# Patient Record
Sex: Female | Born: 1970 | Race: Black or African American | Hispanic: No | Marital: Single | Smoking: Current some day smoker
Health system: Southern US, Community
[De-identification: ages and names within clinical notes are randomized; demographics above are authoritative.]

---

## 1999-11-03 ENCOUNTER — Other Ambulatory Visit: Admission: RE | Admit: 1999-11-03 | Discharge: 1999-11-03 | Payer: Self-pay | Admitting: Obstetrics

## 1999-11-03 ENCOUNTER — Encounter (INDEPENDENT_AMBULATORY_CARE_PROVIDER_SITE_OTHER): Payer: Self-pay

## 2001-06-25 ENCOUNTER — Other Ambulatory Visit: Admission: RE | Admit: 2001-06-25 | Discharge: 2001-06-25 | Payer: Self-pay | Admitting: Obstetrics and Gynecology

## 2001-08-22 ENCOUNTER — Other Ambulatory Visit: Admission: RE | Admit: 2001-08-22 | Discharge: 2001-08-22 | Payer: Self-pay | Admitting: Obstetrics and Gynecology

## 2002-04-13 ENCOUNTER — Other Ambulatory Visit: Admission: RE | Admit: 2002-04-13 | Discharge: 2002-04-13 | Payer: Self-pay | Admitting: Obstetrics and Gynecology

## 2002-08-13 ENCOUNTER — Encounter (INDEPENDENT_AMBULATORY_CARE_PROVIDER_SITE_OTHER): Payer: Self-pay | Admitting: Specialist

## 2002-08-13 ENCOUNTER — Ambulatory Visit (HOSPITAL_COMMUNITY): Admission: RE | Admit: 2002-08-13 | Discharge: 2002-08-13 | Payer: Self-pay | Admitting: Obstetrics and Gynecology

## 2003-07-13 ENCOUNTER — Other Ambulatory Visit: Admission: RE | Admit: 2003-07-13 | Discharge: 2003-07-13 | Payer: Self-pay | Admitting: Obstetrics and Gynecology

## 2004-07-13 ENCOUNTER — Other Ambulatory Visit: Admission: RE | Admit: 2004-07-13 | Discharge: 2004-07-13 | Payer: Self-pay | Admitting: Obstetrics and Gynecology

## 2005-07-19 ENCOUNTER — Other Ambulatory Visit: Admission: RE | Admit: 2005-07-19 | Discharge: 2005-07-19 | Payer: Self-pay | Admitting: Obstetrics and Gynecology

## 2006-09-23 ENCOUNTER — Other Ambulatory Visit: Admission: RE | Admit: 2006-09-23 | Discharge: 2006-09-23 | Payer: Self-pay | Admitting: Obstetrics and Gynecology

## 2007-08-18 ENCOUNTER — Encounter: Admission: RE | Admit: 2007-08-18 | Discharge: 2007-08-18 | Payer: Self-pay | Admitting: Family Medicine

## 2007-10-13 ENCOUNTER — Other Ambulatory Visit: Admission: RE | Admit: 2007-10-13 | Discharge: 2007-10-13 | Payer: Self-pay | Admitting: Obstetrics and Gynecology

## 2008-05-20 ENCOUNTER — Other Ambulatory Visit: Admission: RE | Admit: 2008-05-20 | Discharge: 2008-05-20 | Payer: Self-pay | Admitting: Obstetrics and Gynecology

## 2008-10-29 ENCOUNTER — Other Ambulatory Visit: Admission: RE | Admit: 2008-10-29 | Discharge: 2008-10-29 | Payer: Self-pay | Admitting: Obstetrics and Gynecology

## 2009-08-19 ENCOUNTER — Other Ambulatory Visit: Admission: RE | Admit: 2009-08-19 | Discharge: 2009-08-19 | Payer: Self-pay | Admitting: Obstetrics and Gynecology

## 2010-02-21 ENCOUNTER — Inpatient Hospital Stay (HOSPITAL_COMMUNITY): Admission: AD | Admit: 2010-02-21 | Discharge: 2010-02-24 | Payer: Self-pay | Admitting: Obstetrics and Gynecology

## 2010-08-10 LAB — CBC
HCT: 27.4 % — ABNORMAL LOW (ref 36.0–46.0)
HCT: 33.4 % — ABNORMAL LOW (ref 36.0–46.0)
Hemoglobin: 11.2 g/dL — ABNORMAL LOW (ref 12.0–15.0)
Hemoglobin: 9.3 g/dL — ABNORMAL LOW (ref 12.0–15.0)
MCH: 28.8 pg (ref 26.0–34.0)
MCHC: 33.5 g/dL (ref 30.0–36.0)
MCHC: 33.9 g/dL (ref 30.0–36.0)
MCV: 87.2 fL (ref 78.0–100.0)
RBC: 3.9 MIL/uL (ref 3.87–5.11)
RDW: 15.1 % (ref 11.5–15.5)
WBC: 15.4 10*3/uL — ABNORMAL HIGH (ref 4.0–10.5)

## 2010-08-10 LAB — RPR: RPR Ser Ql: NONREACTIVE

## 2010-10-13 NOTE — Op Note (Signed)
NAMECOLLEEN, KOTLARZ                          ACCOUNT NO.:  [id number]   MEDICAL RECORD NO.:  [id number]                   PATIENT TYPE:  AMB   LOCATION:  SDC                                  FACILITY:  [facility]   PHYSICIAN:  Janine Limbo, M.D.            DATE OF BIRTH:  Oct 16, 1970   DATE OF PROCEDURE:  08/13/2002  DATE OF DISCHARGE:                                 OPERATIVE REPORT   PREOPERATIVE DIAGNOSES:  1. Cervical intraepithelial neoplasia III.  2. 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   POSTOPERATIVE DIAGNOSES:  1. Cervical intraepithelial neoplasia III.  2. 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   PROCEDURE:  Cold knife conization of the cervix.   SURGEON:  Janine Limbo, M.D.   ANESTHESIA:  Monitored anesthetic control, paracervical block.   DISPOSITION:  The patient is a 40 year old female gravida 0 who presents  with the above mentioned diagnoses.  She understands the indications for her  procedure and she accepts the risks of, but not limited to, anesthetic  complications, bleeding, infection, and possible damage to the surrounding  organs.   FINDINGS:  There was a small area of nonstaining that surrounded the  endocervical os.  The uterus and adnexa were normal by examination.   PROCEDURE:  The patient was taken to the operating room where she was given  medications through her IV line.  The patient's perineum and vagina were  prepped with multiple layers of Betadine.  Examination under anesthesia was  performed.  The bladder was drained of urine.  The patient was sterilely  draped.  A paracervical block was placed using 10 mL of 0.5% Marcaine.  The  cervix was then painted with Lugol solution.  The cervix was then injected  with 30 mL of a diluted solution of Pitressin, Marcaine, and normal saline.  The endocervix was sounded.  A circumferential incision was then made around  the endocervical os incorporating all of the nonstaining areas.  The  incision was  extended in a cone like fashion toward the endocervix.  The  specimen was removed and a stitch was placed at the 12 o'clock position.  Angle stitches were placed at 3 o'clock and 9 o'clock prior to making the  incision in the cervix.  Inverting sutures were then placed in the cervix at  the 12 o'clock, 3 o'clock, 6 o'clock, and 9 o'clock positions.  Hemostasis  was noted to be adequate throughout.  The endocervix was sounded and was  noted to be easily patent.  All instruments were then removed.  Repeat  examination was performed and the cervix was noted to be firm and of  adequate length.  Sponge, needle, and instrument counts were correct.  The  estimated blood loss was 10 mL.  The patient tolerated her procedure well.  She was awakened from her anesthetic and taken to the recovery room in  stable condition.   FOLLOWUP INSTRUCTIONS:  The patient was given a prescription for Vicodin and  she will take one to two tablets q.4h. as needed for pain (20 tablets given  with one refill).  She was given a copy of the postoperative  instruction sheet as prepared by the [facility] for  patients who have undergone a dilatation and curettage, but that was then  modified for conization of the cervix.  She will return to see Janine Limbo, M.D. in two to three weeks for follow-up examination.                                               Janine Limbo, M.D.    AVS/MEDQ  D:  08/13/2002  T:  08/13/2002  Job:  [id number]

## 2010-10-13 NOTE — H&P (Signed)
   Carrie Terry, Carrie Terry                          ACCOUNT NO.:  [id number]   MEDICAL RECORD NO.:  [id number]                   PATIENT TYPE:  AMB   LOCATION:  SDC                                  FACILITY:  [facility]   PHYSICIAN:  Janine Limbo, M.D.            DATE OF BIRTH:  15-Aug-1970   DATE OF ADMISSION:  08/13/2002  DATE OF DISCHARGE:                                HISTORY & PHYSICAL   HISTORY OF PRESENT ILLNESS:  The patient is a 40 year-old female, Gravida 0,  who presents for a cold knife conization of the cervix.  The patient had a  Pap smear that showed a low grade squamous intraepithelial lesion.  Colposcopically directed biopsies were performed and the pathology report  returned showing CIN-III.  The endocervical curettage was read as benign.  The test for the human papilloma virus was positive for the high risk  varieties.   PAST MEDICAL HISTORY:  The patient denies hypertension and diabetes.   DRUG ALLERGIES:  No known drug allergies, but the patient reports that she  is allergic to CATS and POLLEN.   SOCIAL HISTORY:  The patient smokes one half pack of cigarettes each day.  She drinks alcohol socially.  She denies other recreational drug uses.   REVIEW OF SYMPTOMS:  Noncontributory.   FAMILY HISTORY:  The patient's family history is significant for  hypertension, diabetes, headaches and sickle cell trait.   PHYSICAL EXAMINATION:  VITAL SIGNS:  Weight is 113 pounds.  HEENT EXAM:  Within normal limits.  CHEST:  Clear.  HEART:  Regular rate and rhythm.  BREASTS:  Without masses.  ABDOMEN:  Non-tender.  EXTREMITIES:  Within normal limits.  NEUROLOGIC EXAM:  Grossly normal.  PELVIC EXAM:  External genitalia is normal.  The vagina is normal.  The  cervix is non-tender.  The uterus is normal size, shape and consistency.  Adnexa:  No masses.  RECTOVAGINAL EXAM:  Confirms the above.   ASSESSMENT:  1. Cervical intraepithelial neoplasia III.  2. High risk human  papilloma virus.    PLAN:  The patient will undergo a cold knife conization of the cervix.  She  understands the indications for her procedure and she accepts the associated  risks of, but not limited to, anesthetic complications, bleeding, infection  and possible damage to the surrounding organs.                                                Janine Limbo, M.D.    AVS/MEDQ  D:  08/11/2002  T:  08/11/2002  Job:  [id number]

## 2011-07-26 ENCOUNTER — Other Ambulatory Visit (HOSPITAL_COMMUNITY)
Admission: RE | Admit: 2011-07-26 | Discharge: 2011-07-26 | Disposition: A | Discharge status: Home / Self Care | Payer: BC Managed Care – PPO | Source: Ambulatory Visit | Attending: Obstetrics and Gynecology | Admitting: Obstetrics and Gynecology

## 2011-07-26 ENCOUNTER — Other Ambulatory Visit: Payer: Self-pay | Admitting: Nurse Practitioner

## 2011-07-26 DIAGNOSIS — R87810 Cervical high risk human papillomavirus (HPV) DNA test positive: Secondary | ICD-10-CM | POA: Insufficient documentation

## 2011-07-26 DIAGNOSIS — Z01419 Encounter for gynecological examination (general) (routine) without abnormal findings: Secondary | ICD-10-CM | POA: Insufficient documentation

## 2011-07-26 DIAGNOSIS — N760 Acute vaginitis: Secondary | ICD-10-CM | POA: Insufficient documentation

## 2012-08-28 ENCOUNTER — Other Ambulatory Visit (HOSPITAL_COMMUNITY)
Admission: RE | Admit: 2012-08-28 | Discharge: 2012-08-28 | Disposition: A | Discharge status: Home / Self Care | Payer: BC Managed Care – PPO | Source: Ambulatory Visit | Attending: Obstetrics and Gynecology | Admitting: Obstetrics and Gynecology

## 2012-08-28 ENCOUNTER — Other Ambulatory Visit: Payer: Self-pay | Admitting: Nurse Practitioner

## 2012-08-28 DIAGNOSIS — R87810 Cervical high risk human papillomavirus (HPV) DNA test positive: Secondary | ICD-10-CM | POA: Insufficient documentation

## 2012-08-28 DIAGNOSIS — Z01419 Encounter for gynecological examination (general) (routine) without abnormal findings: Secondary | ICD-10-CM | POA: Insufficient documentation

## 2012-08-28 DIAGNOSIS — Z1151 Encounter for screening for human papillomavirus (HPV): Secondary | ICD-10-CM | POA: Insufficient documentation

## 2013-10-01 ENCOUNTER — Other Ambulatory Visit: Payer: Self-pay | Admitting: Nurse Practitioner

## 2013-10-01 ENCOUNTER — Other Ambulatory Visit (HOSPITAL_COMMUNITY)
Admission: RE | Admit: 2013-10-01 | Discharge: 2013-10-01 | Disposition: A | Discharge status: Home / Self Care | Payer: BC Managed Care – PPO | Source: Ambulatory Visit | Attending: Nurse Practitioner | Admitting: Nurse Practitioner

## 2013-10-01 DIAGNOSIS — Z124 Encounter for screening for malignant neoplasm of cervix: Secondary | ICD-10-CM | POA: Insufficient documentation

## 2013-10-01 DIAGNOSIS — Z1151 Encounter for screening for human papillomavirus (HPV): Secondary | ICD-10-CM | POA: Insufficient documentation

## 2013-10-01 DIAGNOSIS — R87810 Cervical high risk human papillomavirus (HPV) DNA test positive: Secondary | ICD-10-CM | POA: Insufficient documentation

## 2013-10-13 ENCOUNTER — Other Ambulatory Visit: Payer: Self-pay | Admitting: Nurse Practitioner

## 2014-11-25 ENCOUNTER — Other Ambulatory Visit: Payer: Self-pay | Admitting: Nurse Practitioner

## 2014-11-25 ENCOUNTER — Other Ambulatory Visit (HOSPITAL_COMMUNITY)
Admission: RE | Admit: 2014-11-25 | Discharge: 2014-11-25 | Disposition: A | Discharge status: Home / Self Care | Payer: BLUE CROSS/BLUE SHIELD | Source: Ambulatory Visit | Attending: Obstetrics and Gynecology | Admitting: Obstetrics and Gynecology

## 2014-11-25 DIAGNOSIS — Z01419 Encounter for gynecological examination (general) (routine) without abnormal findings: Secondary | ICD-10-CM | POA: Insufficient documentation

## 2014-11-30 ENCOUNTER — Other Ambulatory Visit: Payer: Self-pay | Admitting: Nurse Practitioner

## 2014-11-30 DIAGNOSIS — Z1231 Encounter for screening mammogram for malignant neoplasm of breast: Secondary | ICD-10-CM

## 2014-12-01 LAB — CYTOLOGY - PAP

## 2014-12-03 ENCOUNTER — Ambulatory Visit
Admission: RE | Admit: 2014-12-03 | Discharge: 2014-12-03 | Disposition: A | Discharge status: Home / Self Care | Payer: BLUE CROSS/BLUE SHIELD | Source: Ambulatory Visit | Attending: Nurse Practitioner | Admitting: Nurse Practitioner

## 2014-12-03 DIAGNOSIS — Z1231 Encounter for screening mammogram for malignant neoplasm of breast: Secondary | ICD-10-CM

## 2016-05-26 ENCOUNTER — Ambulatory Visit (HOSPITAL_COMMUNITY)
Admission: EM | Admit: 2016-05-26 | Discharge: 2016-05-26 | Disposition: A | Discharge status: Home / Self Care | Payer: BLUE CROSS/BLUE SHIELD | Attending: Emergency Medicine | Admitting: Emergency Medicine

## 2016-05-26 ENCOUNTER — Telehealth (HOSPITAL_COMMUNITY): Payer: Self-pay | Admitting: Radiology

## 2016-05-26 ENCOUNTER — Encounter (HOSPITAL_COMMUNITY): Payer: Self-pay | Admitting: Emergency Medicine

## 2016-05-26 DIAGNOSIS — M5440 Lumbago with sciatica, unspecified side: Secondary | ICD-10-CM

## 2016-05-26 MED ORDER — KETOROLAC TROMETHAMINE 30 MG/ML IJ SOLN
30.0000 mg | Freq: Once | INTRAMUSCULAR | Status: AC
  Administered 2016-05-26: 30 mg via INTRAMUSCULAR

## 2016-05-26 MED ORDER — KETOROLAC TROMETHAMINE 60 MG/2ML IM SOLN
INTRAMUSCULAR | Status: AC
  Filled 2016-05-26: qty 2

## 2016-05-26 MED ORDER — DICLOFENAC SODIUM 75 MG PO TBEC: 75.0000 mg | DELAYED_RELEASE_TABLET | Freq: Two times a day (BID) | ORAL | 0 refills | Status: AC

## 2016-05-26 MED ORDER — PREDNISONE 10 MG PO TABS: 40.0000 mg | ORAL_TABLET | Freq: Every day | ORAL | 0 refills | Status: AC

## 2016-05-26 NOTE — Telephone Encounter (Signed)
[facility] pharmacy called needing to clarify prednisone Rx given  Per Sherlynn StallsJennifer O, NP... It should be 40mg  daily x5 days w/5 tabs to be dispensed.

## 2016-05-26 NOTE — ED Provider Notes (Signed)
CSN: [id number]     Arrival date & time 05/26/16  1200 History   First MD Initiated Contact with Patient 05/26/16 1235     Chief Complaint  Patient presents with  . Back Pain   (Consider location/radiation/quality/duration/timing/severity/associated sxs/prior Treatment) 45 y.o. female presents with lower back pain with radiation to medial aspect of right leg. Patient describes the pain as "inside" Condition is acute in nature and coincides with a change in work where patient reports she has more physical activity. Condition is made better by ibuprofen 800mg  . Condition is made worse by nothing. Patient denies any pain with pain with palpation  urination, decrease sensitivity. Loss of bladder function        History reviewed. No pertinent past medical history. History reviewed. No pertinent surgical history. No family history on file. Social History  Substance Use Topics  . Smoking status: Current Some Day Smoker  . Smokeless tobacco: Never Used  . Alcohol use Yes   OB History    No data available     Review of Systems  All other systems reviewed and are negative.   Allergies  Patient has no known allergies.  Home Medications   Prior to Admission medications   Medication Sig Start Date End Date Taking? Authorizing Provider  ibuprofen (ADVIL,MOTRIN) 400 MG tablet Take 400 mg by mouth every 6 (six) hours as needed.   Yes Historical Provider, MD  diclofenac (VOLTAREN) 75 MG EC tablet Take 1 tablet (75 mg total) by mouth 2 (two) times daily. 05/26/16 06/05/16  Alene MiresJennifer C Keyunna Coco, NP  predniSONE (DELTASONE) 10 MG tablet Take 4 tablets (40 mg total) by mouth daily. 05/26/16 05/31/16  Alene MiresJennifer C Zephyr Ridley, NP   Meds Ordered and Administered this Visit   Medications  ketorolac (TORADOL) 30 MG/ML injection 30 mg (not administered)    BP 116/80 (BP Location: Right Arm)   Pulse 94   Temp 98.4 F (36.9 C) (Oral)   Resp 18   SpO2 97%  No data found.   Physical Exam   Constitutional: She is oriented to person, place, and time. She appears well-developed and well-nourished.  HENT:  Head: Normocephalic and atraumatic.  Eyes: Conjunctivae are normal.  Neck: Normal range of motion.  Cardiovascular: Normal rate and regular rhythm.   Pulmonary/Chest: Effort normal and breath sounds normal.  Musculoskeletal: Normal range of motion.  Neurological: She is alert and oriented to person, place, and time.  Skin: Skin is warm.  Psychiatric: She has a normal mood and affect.  Nursing note and vitals reviewed.   Urgent Care Course   Clinical Course     Procedures (including critical care time)  Labs Review Labs Reviewed - No data to display  Imaging Review No results found.      MDM   1. Acute right-sided low back pain with sciatica, sciatica laterality unspecified        Alene MiresJennifer C Adrian Dinovo, NP 05/26/16 1244

## 2016-05-26 NOTE — ED Triage Notes (Signed)
Patient has started a new activity at work recently after 16 years.  Pain in lower back and tingling in right leg.

## 2016-12-21 ENCOUNTER — Other Ambulatory Visit: Payer: Self-pay | Admitting: Nurse Practitioner

## 2016-12-21 DIAGNOSIS — Z1231 Encounter for screening mammogram for malignant neoplasm of breast: Secondary | ICD-10-CM

## 2016-12-28 ENCOUNTER — Ambulatory Visit: Payer: BLUE CROSS/BLUE SHIELD

## 2017-01-04 IMAGING — MG MM SCREEN MAMMOGRAM BILATERAL
4 series · 4 of 4 positions shown · non-contrast
Comparison: Previous exam(s).

ADDENDUM:
There are no prior exams for comparison.  This is a baseline exam.
CLINICAL DATA: Screening.

EXAM:
DIGITAL SCREENING BILATERAL MAMMOGRAM WITH CAD

[R CC]
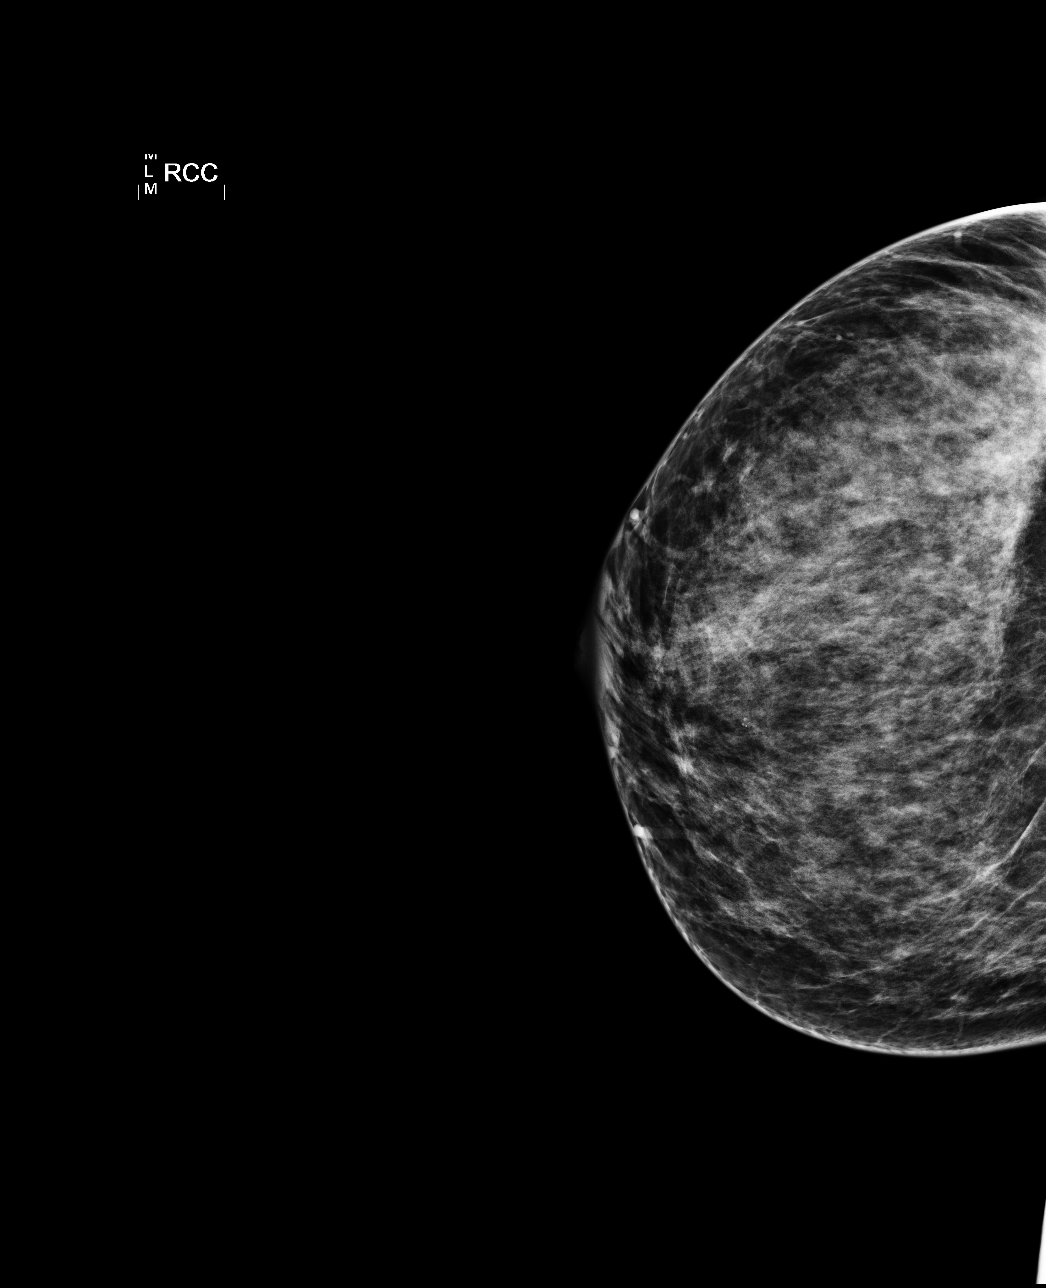

[L CC]
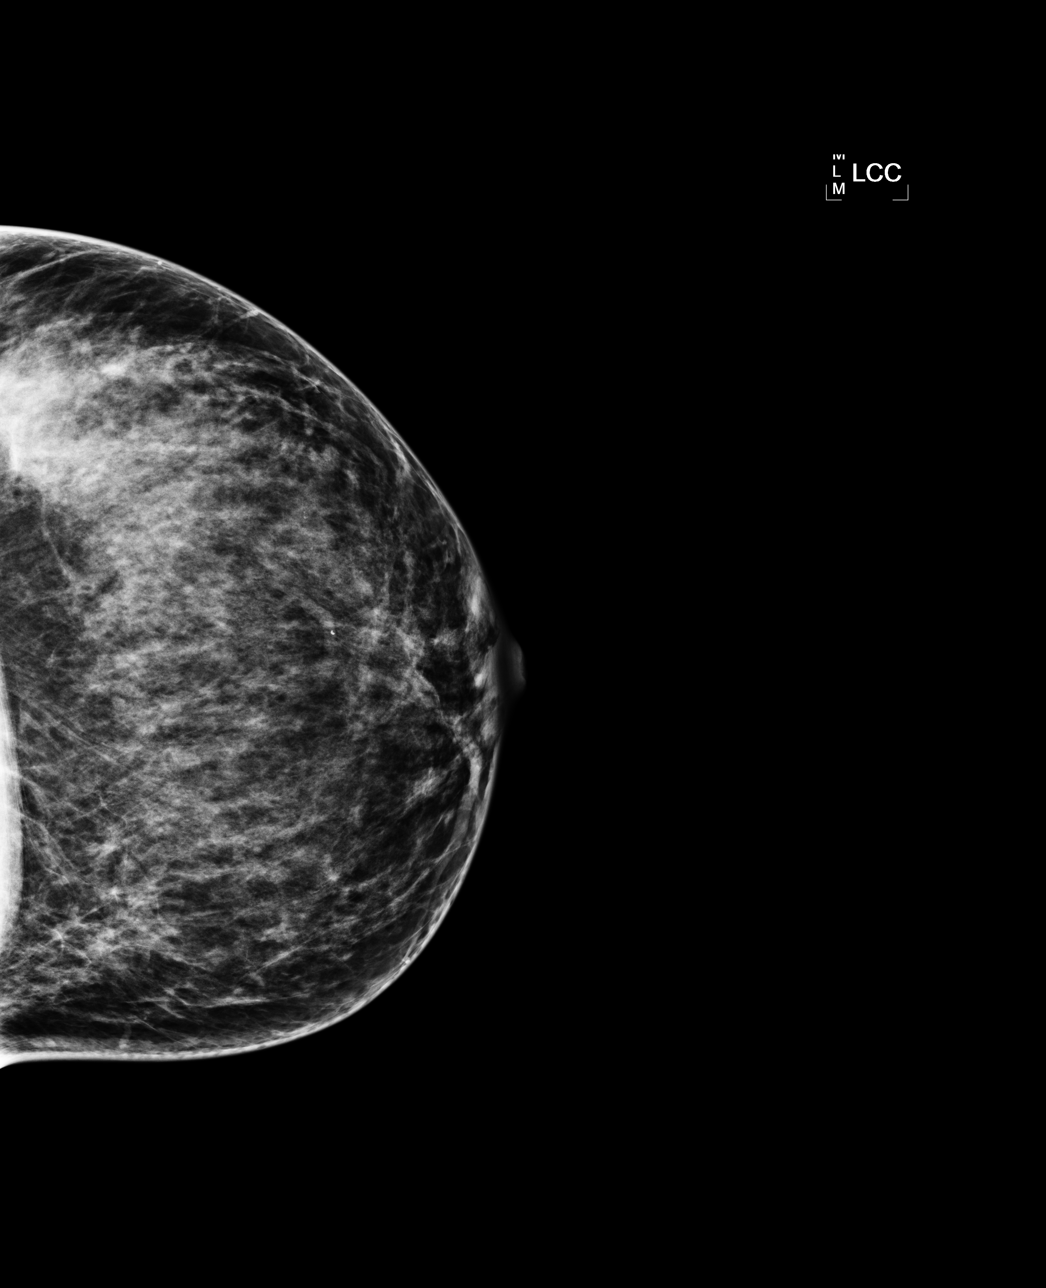

[L MLO]
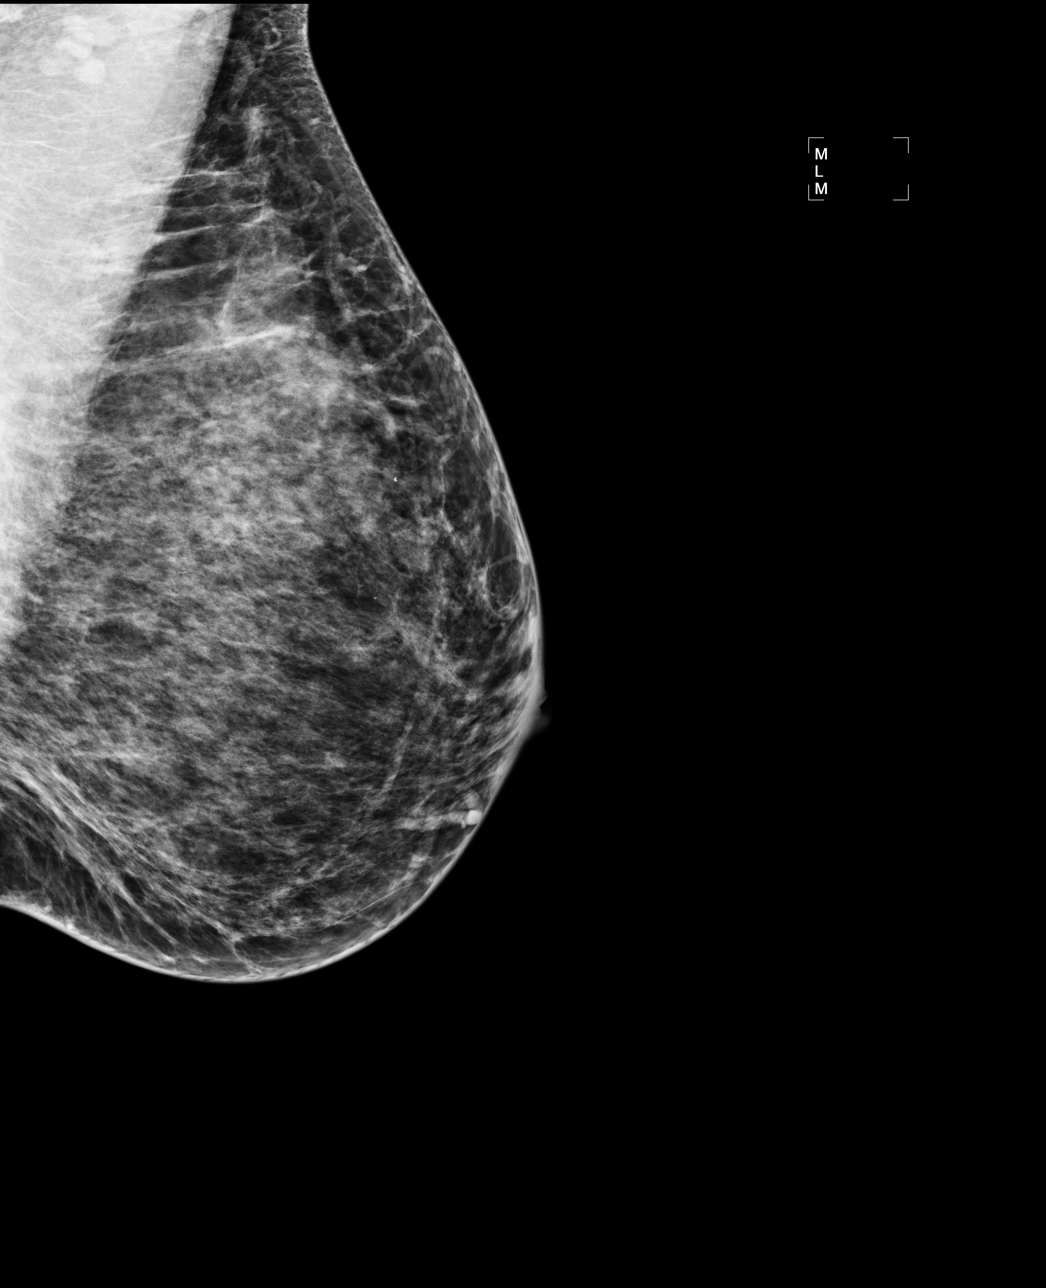

[R MLO]
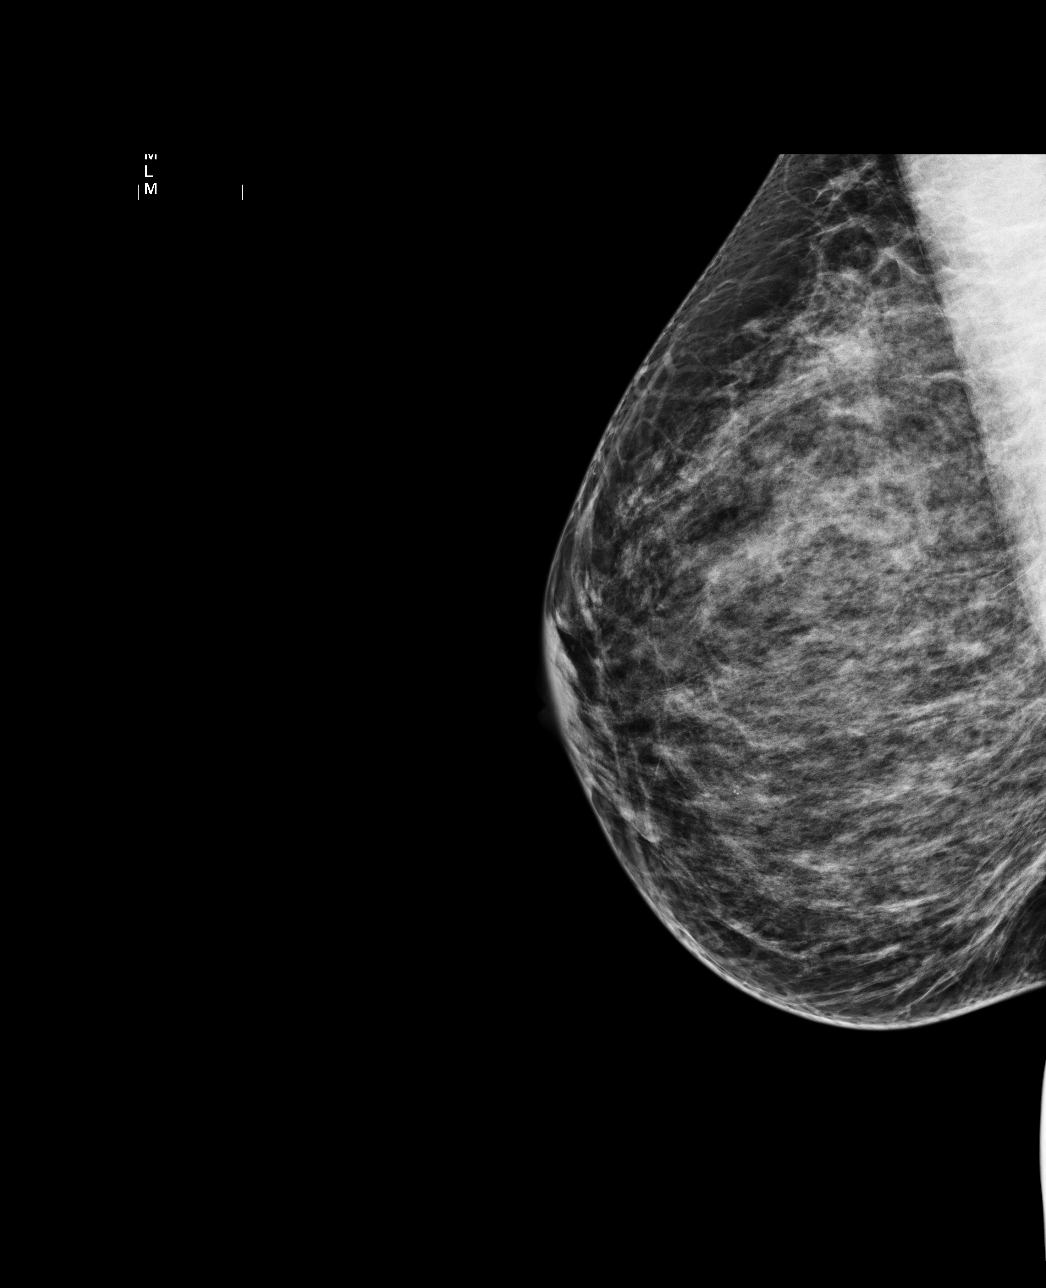

[4 of 4 positions shown; findings below may reference images not displayed]

ACR Breast Density Category c: The breast tissue is heterogeneously
dense, which may obscure small masses.
FINDINGS: There are no findings suspicious for malignancy. Images were
processed with CAD.
IMPRESSION: No mammographic evidence of malignancy. A result letter of this
screening mammogram will be mailed directly to the patient.

RECOMMENDATION:
Screening mammogram in one year. (Code:CY-O-UZV)

BI-RADS CATEGORY  1: Negative.

## 2018-01-02 ENCOUNTER — Other Ambulatory Visit (HOSPITAL_COMMUNITY)
Admission: RE | Admit: 2018-01-02 | Discharge: 2018-01-02 | Disposition: A | Discharge status: Home / Self Care | Payer: BLUE CROSS/BLUE SHIELD | Source: Ambulatory Visit | Attending: Nurse Practitioner | Admitting: Nurse Practitioner

## 2018-01-02 ENCOUNTER — Other Ambulatory Visit: Payer: Self-pay | Admitting: Nurse Practitioner

## 2018-01-02 DIAGNOSIS — Z01419 Encounter for gynecological examination (general) (routine) without abnormal findings: Secondary | ICD-10-CM | POA: Insufficient documentation

## 2018-01-06 LAB — CYTOLOGY - PAP
DIAGNOSIS: NEGATIVE
HPV (WINDOPATH): NOT DETECTED

## 2020-06-27 ENCOUNTER — Other Ambulatory Visit (HOSPITAL_COMMUNITY): Payer: Self-pay | Admitting: Obstetrics and Gynecology

## 2020-09-19 ENCOUNTER — Other Ambulatory Visit (HOSPITAL_COMMUNITY): Payer: Self-pay

## 2020-09-19 MED FILL — Medroxyprogesterone Acetate IM Susp 150 MG/ML: INTRAMUSCULAR | 90 days supply | Qty: 1 | Fill #0 | Status: AC

## 2020-12-05 ENCOUNTER — Other Ambulatory Visit (HOSPITAL_COMMUNITY): Payer: Self-pay

## 2020-12-05 MED FILL — Medroxyprogesterone Acetate IM Susp 150 MG/ML: INTRAMUSCULAR | 90 days supply | Qty: 1 | Fill #1 | Status: AC

## 2021-01-11 LAB — EXTERNAL GENERIC LAB PROCEDURE

## 2021-02-21 ENCOUNTER — Other Ambulatory Visit (HOSPITAL_COMMUNITY): Payer: Self-pay

## 2021-02-21 MED ORDER — MEDROXYPROGESTERONE ACETATE 150 MG/ML IM SUSY
1.0000 mL | PREFILLED_SYRINGE | INTRAMUSCULAR | 0 refills | Status: DC
  Filled 2021-02-21: qty 1, 90d supply, fill #0

## 2021-05-09 ENCOUNTER — Other Ambulatory Visit (HOSPITAL_COMMUNITY): Payer: Self-pay

## 2021-05-09 MED ORDER — MEDROXYPROGESTERONE ACETATE 150 MG/ML IM SUSY
1.0000 mL | PREFILLED_SYRINGE | INTRAMUSCULAR | 3 refills | Status: DC
  Filled 2021-05-09: qty 1, 90d supply, fill #0
  Filled 2021-07-31: qty 1, 90d supply, fill #1
  Filled 2021-11-06: qty 1, 90d supply, fill #2
  Filled 2022-01-22: qty 1, 90d supply, fill #3

## 2021-07-31 ENCOUNTER — Other Ambulatory Visit (HOSPITAL_COMMUNITY): Payer: Self-pay

## 2021-11-06 ENCOUNTER — Other Ambulatory Visit (HOSPITAL_COMMUNITY): Payer: Self-pay

## 2022-01-22 ENCOUNTER — Other Ambulatory Visit (HOSPITAL_COMMUNITY): Payer: Self-pay

## 2022-04-09 ENCOUNTER — Other Ambulatory Visit (HOSPITAL_COMMUNITY): Payer: Self-pay

## 2022-04-09 ENCOUNTER — Other Ambulatory Visit: Payer: Self-pay

## 2022-04-09 MED ORDER — MEDROXYPROGESTERONE ACETATE 150 MG/ML IM SUSY
1.0000 mL | PREFILLED_SYRINGE | INTRAMUSCULAR | 0 refills | Status: AC
  Filled 2022-04-09 – 2022-07-02 (×4): qty 1, 90d supply, fill #0

## 2022-04-09 MED ORDER — MEDROXYPROGESTERONE ACETATE 150 MG/ML IM SUSY
150.0000 mg | PREFILLED_SYRINGE | INTRAMUSCULAR | 0 refills | Status: DC
  Filled 2022-04-09: qty 1, 90d supply, fill #0

## 2022-06-25 ENCOUNTER — Other Ambulatory Visit (HOSPITAL_COMMUNITY): Payer: Self-pay

## 2022-06-25 ENCOUNTER — Other Ambulatory Visit: Payer: Self-pay

## 2022-07-02 ENCOUNTER — Other Ambulatory Visit: Payer: Self-pay

## 2022-07-02 ENCOUNTER — Other Ambulatory Visit (HOSPITAL_COMMUNITY): Payer: Self-pay

## 2022-09-20 ENCOUNTER — Other Ambulatory Visit: Payer: Self-pay

## 2022-09-20 ENCOUNTER — Other Ambulatory Visit (HOSPITAL_COMMUNITY): Payer: Self-pay

## 2023-08-20 ENCOUNTER — Other Ambulatory Visit: Payer: Self-pay | Admitting: Nurse Practitioner

## 2023-08-20 DIAGNOSIS — N179 Acute kidney failure, unspecified: Secondary | ICD-10-CM
